# Patient Record
Sex: Female | Born: 1990 | Race: Black or African American | Hispanic: No | Marital: Single | Smoking: Current every day smoker
Health system: Southern US, Community
[De-identification: ages and names within clinical notes are randomized; demographics above are authoritative.]

---

## 2016-04-02 ENCOUNTER — Inpatient Hospital Stay (HOSPITAL_COMMUNITY)
Admission: AD | Admit: 2016-04-02 | Discharge: 2016-04-02 | Disposition: A | Discharge status: Home / Self Care | Payer: Self-pay | Source: Ambulatory Visit | Attending: Obstetrics and Gynecology | Admitting: Obstetrics and Gynecology

## 2016-04-02 ENCOUNTER — Encounter (HOSPITAL_COMMUNITY): Payer: Self-pay | Admitting: *Deleted

## 2016-04-02 DIAGNOSIS — N6310 Unspecified lump in the right breast, unspecified quadrant: Secondary | ICD-10-CM

## 2016-04-02 DIAGNOSIS — N6315 Unspecified lump in the right breast, overlapping quadrants: Secondary | ICD-10-CM

## 2016-04-02 DIAGNOSIS — N63 Unspecified lump in breast: Secondary | ICD-10-CM | POA: Insufficient documentation

## 2016-04-02 DIAGNOSIS — N611 Abscess of the breast and nipple: Secondary | ICD-10-CM | POA: Insufficient documentation

## 2016-04-02 DIAGNOSIS — N644 Mastodynia: Secondary | ICD-10-CM | POA: Insufficient documentation

## 2016-04-02 DIAGNOSIS — N6459 Other signs and symptoms in breast: Secondary | ICD-10-CM | POA: Insufficient documentation

## 2016-04-02 MED ORDER — CEPHALEXIN 500 MG PO CAPS: 500.0000 mg | ORAL_CAPSULE | Freq: Four times a day (QID) | ORAL | 0 refills | Status: AC

## 2016-04-02 NOTE — MAU Provider Note (Signed)
  History     CSN: [id number]  Arrival date and time: 04/02/16 0139   First Provider Initiated Contact with Patient 04/02/16 0155      Chief Complaint  Patient presents with  . Breast Pain   Gloria Maxwell is a 25 y.o. G0P0000 who presents today with a right breast lump. She states that she has had the lump for about 2 months. It went away, and then returned. She reports that it is painful, and rates her pain 7/10. She has not taken anything for the pain at this time. She cannot identify anything that happened prior to the lump/pain developing. She denies any nipple discharge. She does report that the nipple is inverted on the side with the pain/lump. She denies any family hx of breast cancer.     No past medical history on file.  No past surgical history on file.  No family history on file.  Social History  Substance Use Topics  . Smoking status: Not on file  . Smokeless tobacco: Not on file  . Alcohol use Not on file    Allergies: No Known Allergies  No prescriptions prior to admission.    Review of Systems  Constitutional: Negative for chills and fever.  Gastrointestinal: Negative for abdominal pain, constipation, diarrhea, nausea and vomiting.  Skin: Positive for rash (redness on the right breast ). Negative for itching.   Physical Exam   Last menstrual period 03/19/2016.  Physical Exam  Nursing note and vitals reviewed. Constitutional: She is oriented to person, place, and time. She appears well-developed and well-nourished. No distress.  HENT:  Head: Normocephalic.  Cardiovascular: Normal rate.   Respiratory: Effort normal. Right breast exhibits inverted nipple, mass, skin change (redness ) and tenderness. Right breast exhibits no nipple discharge. Left breast exhibits no inverted nipple, no mass, no nipple discharge, no skin change and no tenderness. Breasts are asymmetrical.    GI: Soft. There is no tenderness.  Neurological: She is alert and oriented to  person, place, and time.  Skin: Skin is warm and dry.  Psychiatric: She has a normal mood and affect.   3cmx3cm reddened, tender lump at 12:00 to the right nipple.  MAU Course  Procedures  MDM   Assessment and Plan   1. Breast lump on right side at 12 o'clock position   2. Breast abscess    DC home Comfort measures reviewed  RX: keflex QID x 7 #28  Return to MAU as needed FU with breast center for breast US.      Tawnya CrookHogan, [name] 04/02/2016, 1:59 AM

## 2016-04-02 NOTE — Discharge Instructions (Signed)
Fibroadenoma Fibroadenoma is a type of breast tumor that is not cancerous (is benign). These tumors are made up of breast tissue and the tissue that holds breast tissue together (connective tissue). There are several types of fibroadenomas:  Simple fibroadenoma. This is the most common type. It consists of a single type of tissue throughout the tumor.  Complex fibroadenoma. This type of tumor contains more than one kind of tissue or irregular tissue.  Juvenile fibroadenoma. This is a type of tumor that can develop in adolescent girls. It tends to grow larger over time than other adenomas. A fibroadenoma usually occurs as a single lump, but sometimes there may be more than one lump. Fibroadenomas vary in size. They can occur in one breast or in both breasts. Some fibroadenomas are too small to feel, but a larger one may feel like a firm, smooth lump that moves beneath your fingers. Although fibroadenomas are not cancer, having a fibroadenoma may slightly increase your risk for developing breast cancer in the future. CAUSES The exact cause of fibroadenoma is not known. RISK FACTORS This condition is more likely to develop in:  Women who are 20-30 years of age.  Women of African-American descent. SYMPTOMS A fibroadenoma may not cause any symptoms. These tumors usually do not cause pain unless they grow to a large size. A fibroadenoma may feel like a lump in your breast that is:  Firm.  Round.  Smooth.  Slightly moveable. DIAGNOSIS You may notice a breast lump during a breast self-exam. Your health care provider may discover it during a routine breast exam or mammogram. Your health care provider may suspect fibroadenoma if you have a breast lump that feels firm, round, and smooth and appears smooth on your mammogram. Other tests may be done to confirm the diagnosis, including:  An ultrasound to check for fluid inside the lump (cystic tumor).  A procedure that uses a needle to remove  fluid from a cystic tumor. The fluid is then checked under a microscope for cancer cells.  A mammogram to examine a lump that is not cystic (is solid).  A procedure that uses a needle to remove a sample of tissue from the lump (breast biopsy) to examine under a microscope. This test is the only method that can be used to confirm that a tumor is a fibroadenoma and is not cancer. TREATMENT Treatment for this condition may include:  Having breast exams regularly to check for changes in your fibroadenoma.  Having the fibroadenoma removed. A fibroadenoma may be removed if it is:  Large.  Continuing to grow.  Causing symptoms.  Changing the appearance of your breast.  A juvenile fibroadenoma. These tend to grow large over time. HOME CARE INSTRUCTIONS  If you had a fibroadenoma removed, follow instructions from your health care provider for care after the procedure.  Perform breast self-exams at home as told by your health care provider.  Keep all follow-up visits as told by your health care provider. This is important. SEEK MEDICAL CARE IF:  Your fibroadenoma becomes larger, feels different, or becomes painful.  You find a new breast lump.  You have any changes in the skin that covers your breast. These include:  Dimpling.  Bruising.  Thickening.  Redness.  You have any changes in your nipple.  You have fluid leaking from your nipple.   This information is not intended to replace advice given to you by your health care provider. Make sure you discuss any questions you have with   your health care provider.   Document Released: 12/10/2014 Document Reviewed: 12/10/2014 Elsevier Interactive Patient Education 2016 [employer]  

## 2016-04-02 NOTE — MAU Note (Signed)
NO BIRTH CONTROL.     SAYS  HAS  LUMP ON RIGHT  BREAST  - STARTED IN June-    NO MED  FOR  PAIN.     NO D/C  FROM [facility].

## 2016-08-06 ENCOUNTER — Other Ambulatory Visit: Payer: Self-pay | Admitting: Advanced Practice Midwife

## 2017-12-03 ENCOUNTER — Emergency Department (HOSPITAL_COMMUNITY): Payer: Self-pay

## 2017-12-03 ENCOUNTER — Emergency Department (HOSPITAL_COMMUNITY)
Admission: EM | Admit: 2017-12-03 | Discharge: 2017-12-04 | Disposition: A | Discharge status: Home / Self Care | Payer: Self-pay | Attending: Emergency Medicine | Admitting: Emergency Medicine

## 2017-12-03 ENCOUNTER — Encounter (HOSPITAL_COMMUNITY): Payer: Self-pay | Admitting: Emergency Medicine

## 2017-12-03 ENCOUNTER — Other Ambulatory Visit: Payer: Self-pay

## 2017-12-03 DIAGNOSIS — Y9389 Activity, other specified: Secondary | ICD-10-CM | POA: Insufficient documentation

## 2017-12-03 DIAGNOSIS — Y999 Unspecified external cause status: Secondary | ICD-10-CM | POA: Insufficient documentation

## 2017-12-03 DIAGNOSIS — F17200 Nicotine dependence, unspecified, uncomplicated: Secondary | ICD-10-CM | POA: Insufficient documentation

## 2017-12-03 DIAGNOSIS — Z79899 Other long term (current) drug therapy: Secondary | ICD-10-CM | POA: Insufficient documentation

## 2017-12-03 DIAGNOSIS — W228XXA Striking against or struck by other objects, initial encounter: Secondary | ICD-10-CM | POA: Insufficient documentation

## 2017-12-03 DIAGNOSIS — Y929 Unspecified place or not applicable: Secondary | ICD-10-CM | POA: Insufficient documentation

## 2017-12-03 DIAGNOSIS — S52125A Nondisplaced fracture of head of left radius, initial encounter for closed fracture: Secondary | ICD-10-CM | POA: Insufficient documentation
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
## 2017-12-04 MED ORDER — IBUPROFEN 800 MG PO TABS
800.0000 mg | ORAL_TABLET | Freq: Once | ORAL | Status: AC
  Administered 2017-12-04: 800 mg via ORAL
  Filled 2017-12-04: qty 1

## 2017-12-04 MED ORDER — IBUPROFEN 600 MG PO TABS: 600.0000 mg | ORAL_TABLET | Freq: Four times a day (QID) | ORAL | 0 refills | Status: AC | PRN

## 2017-12-04 NOTE — Discharge Instructions (Signed)
As discussed, take ibuprofen every 8 hours for pain. Keep your splint on at all times and follow-up with orthopedics by calling Dr. Carlos Levering office Monday morning for an appointment.  Return if symptoms worsen, loss of sensation, change in color, fever or new concerning symptoms in the meantime
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
## 2018-01-27 ENCOUNTER — Emergency Department (HOSPITAL_COMMUNITY): Payer: No Typology Code available for payment source

## 2018-01-27 ENCOUNTER — Encounter (HOSPITAL_COMMUNITY): Payer: Self-pay | Admitting: Emergency Medicine

## 2018-01-27 ENCOUNTER — Emergency Department (HOSPITAL_COMMUNITY)
Admission: EM | Admit: 2018-01-27 | Discharge: 2018-01-27 | Disposition: A | Discharge status: Home / Self Care | Payer: No Typology Code available for payment source | Attending: Emergency Medicine | Admitting: Emergency Medicine

## 2018-01-27 ENCOUNTER — Other Ambulatory Visit: Payer: Self-pay

## 2018-01-27 DIAGNOSIS — Z23 Encounter for immunization: Secondary | ICD-10-CM | POA: Diagnosis not present

## 2018-01-27 DIAGNOSIS — Y92410 Unspecified street and highway as the place of occurrence of the external cause: Secondary | ICD-10-CM | POA: Insufficient documentation

## 2018-01-27 DIAGNOSIS — Y999 Unspecified external cause status: Secondary | ICD-10-CM | POA: Diagnosis not present

## 2018-01-27 DIAGNOSIS — Y9389 Activity, other specified: Secondary | ICD-10-CM | POA: Diagnosis not present

## 2018-01-27 DIAGNOSIS — S52501A Unspecified fracture of the lower end of right radius, initial encounter for closed fracture: Secondary | ICD-10-CM | POA: Diagnosis not present

## 2018-01-27 DIAGNOSIS — S59901A Unspecified injury of right elbow, initial encounter: Secondary | ICD-10-CM | POA: Diagnosis present

## 2018-01-27 LAB — CBC WITH DIFFERENTIAL/PLATELET
Abs Immature Granulocytes: 0 10*3/uL (ref 0.0–0.1)
Basophils Absolute: 0 10*3/uL (ref 0.0–0.1)
Basophils Relative: 1 %
EOS ABS: 0.2 10*3/uL (ref 0.0–0.7)
EOS PCT: 3 %
HEMATOCRIT: 39.2 % (ref 36.0–46.0)
Hemoglobin: 12.7 g/dL (ref 12.0–15.0)
IMMATURE GRANULOCYTES: 0 %
LYMPHS ABS: 2.5 10*3/uL (ref 0.7–4.0)
Lymphocytes Relative: 34 %
MCH: 31.1 pg (ref 26.0–34.0)
MCHC: 32.4 g/dL (ref 30.0–36.0)
MCV: 96.1 fL (ref 78.0–100.0)
MONOS PCT: 9 %
Monocytes Absolute: 0.7 10*3/uL (ref 0.1–1.0)
NEUTROS PCT: 53 %
Neutro Abs: 3.9 10*3/uL (ref 1.7–7.7)
PLATELETS: 304 10*3/uL (ref 150–400)
RBC: 4.08 MIL/uL (ref 3.87–5.11)
RDW: 13 % (ref 11.5–15.5)
WBC: 7.3 10*3/uL (ref 4.0–10.5)

## 2018-01-27 LAB — BASIC METABOLIC PANEL
Anion gap: 13 (ref 5–15)
BUN: 7 mg/dL (ref 6–20)
CHLORIDE: 105 mmol/L (ref 101–111)
CO2: 21 mmol/L — ABNORMAL LOW (ref 22–32)
CREATININE: 0.83 mg/dL (ref 0.44–1.00)
Calcium: 8.9 mg/dL (ref 8.9–10.3)
GFR calc Af Amer: >60 mL/min (ref >60)
GFR calc non Af Amer: >60 mL/min (ref >60)
GLUCOSE: 85 mg/dL (ref 65–99)
POTASSIUM: 3.3 mmol/L — AB (ref 3.5–5.1)
Sodium: 139 mmol/L (ref 135–145)

## 2018-01-27 LAB — PROTIME-INR
INR: 0.98
Prothrombin Time: 12.9 seconds (ref 11.4–15.2)

## 2018-01-27 LAB — I-STAT BETA HCG BLOOD, ED (MC, WL, AP ONLY): I-stat hCG, quantitative: <5 m[IU]/mL (ref <5)

## 2018-01-27 MED ORDER — FENTANYL CITRATE (PF) 100 MCG/2ML IJ SOLN: 25.0000 ug | Freq: Once | INTRAMUSCULAR | Status: DC

## 2018-01-27 MED ORDER — HYDROCODONE-ACETAMINOPHEN 5-325 MG PO TABS: 1.0000 | ORAL_TABLET | Freq: Four times a day (QID) | ORAL | 0 refills | Status: AC | PRN

## 2018-01-27 MED ORDER — TETANUS-DIPHTH-ACELL PERTUSSIS 5-2.5-18.5 LF-MCG/0.5 IM SUSP
0.5000 mL | Freq: Once | INTRAMUSCULAR | Status: AC
  Administered 2018-01-27: 0.5 mL via INTRAMUSCULAR
  Filled 2018-01-27: qty 0.5

## 2018-01-27 NOTE — Progress Notes (Signed)
Orthopedic Tech Progress Note Patient Details:  Erling CruzShronda Defenbaugh [birth year] [id number]  Ortho Devices Type of Ortho Device: Ace wrap, Arm sling, Sugartong splint Ortho Device/Splint Location: Bilateral arm slings Ortho Device/Splint Interventions: Application   Post Interventions Patient Tolerated: Well Instructions Provided: Care of device   Saul FordyceJennifer C Brendin Situ 01/27/2018, 7:04 PM
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
## 2018-01-27 NOTE — ED Notes (Signed)
Patient left at this time with all belongings. 
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
## 2018-01-29 ENCOUNTER — Encounter (HOSPITAL_COMMUNITY): Payer: Self-pay | Admitting: Emergency Medicine
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
## 2021-08-24 ENCOUNTER — Ambulatory Visit: Payer: Self-pay | Admitting: Podiatry

## 2022-01-07 ENCOUNTER — Ambulatory Visit (INDEPENDENT_AMBULATORY_CARE_PROVIDER_SITE_OTHER): Payer: Self-pay | Admitting: Podiatry

## 2022-01-07 DIAGNOSIS — Z91199 Patient's noncompliance with other medical treatment and regimen due to unspecified reason: Secondary | ICD-10-CM

## 2022-01-09 NOTE — Progress Notes (Signed)
2nd no-show for appt

## 2022-10-04 ENCOUNTER — Ambulatory Visit: Payer: BC Managed Care – PPO | Admitting: Podiatry
